# Patient Record
Sex: Female | Born: 1935 | Race: White | Hispanic: No | Smoking: Former smoker
Health system: Southern US, Community
[De-identification: ages and names within clinical notes are randomized; demographics above are authoritative.]

## PROBLEM LIST (undated history)

## (undated) DIAGNOSIS — H3530 Unspecified macular degeneration: Secondary | ICD-10-CM

## (undated) DIAGNOSIS — F0280 Dementia in other diseases classified elsewhere without behavioral disturbance: Secondary | ICD-10-CM

## (undated) DIAGNOSIS — G309 Alzheimer's disease, unspecified: Secondary | ICD-10-CM

## (undated) HISTORY — PX: OTHER SURGICAL HISTORY: SHX169

---

## 2010-12-09 ENCOUNTER — Encounter (INDEPENDENT_AMBULATORY_CARE_PROVIDER_SITE_OTHER): Payer: Medicare Other | Admitting: Ophthalmology

## 2010-12-09 DIAGNOSIS — H353290 Exudative age-related macular degeneration, unspecified eye, stage unspecified: Secondary | ICD-10-CM

## 2010-12-09 DIAGNOSIS — H43819 Vitreous degeneration, unspecified eye: Secondary | ICD-10-CM

## 2010-12-09 DIAGNOSIS — H3530 Unspecified macular degeneration: Secondary | ICD-10-CM

## 2010-12-12 ENCOUNTER — Encounter (INDEPENDENT_AMBULATORY_CARE_PROVIDER_SITE_OTHER): Payer: Self-pay | Admitting: Ophthalmology

## 2010-12-16 ENCOUNTER — Encounter (INDEPENDENT_AMBULATORY_CARE_PROVIDER_SITE_OTHER): Payer: Medicare Other | Admitting: Ophthalmology

## 2010-12-16 DIAGNOSIS — H3530 Unspecified macular degeneration: Secondary | ICD-10-CM

## 2010-12-16 DIAGNOSIS — H43819 Vitreous degeneration, unspecified eye: Secondary | ICD-10-CM

## 2010-12-16 DIAGNOSIS — H353290 Exudative age-related macular degeneration, unspecified eye, stage unspecified: Secondary | ICD-10-CM

## 2011-01-27 ENCOUNTER — Encounter (INDEPENDENT_AMBULATORY_CARE_PROVIDER_SITE_OTHER): Payer: Medicare Other | Admitting: Ophthalmology

## 2011-01-30 ENCOUNTER — Encounter (INDEPENDENT_AMBULATORY_CARE_PROVIDER_SITE_OTHER): Payer: Medicare Other | Admitting: Ophthalmology

## 2011-01-30 DIAGNOSIS — H353290 Exudative age-related macular degeneration, unspecified eye, stage unspecified: Secondary | ICD-10-CM

## 2011-01-30 DIAGNOSIS — H3530 Unspecified macular degeneration: Secondary | ICD-10-CM

## 2011-01-30 DIAGNOSIS — H43819 Vitreous degeneration, unspecified eye: Secondary | ICD-10-CM

## 2011-02-20 ENCOUNTER — Encounter (INDEPENDENT_AMBULATORY_CARE_PROVIDER_SITE_OTHER): Payer: Medicare Other | Admitting: Ophthalmology

## 2011-02-20 DIAGNOSIS — H353290 Exudative age-related macular degeneration, unspecified eye, stage unspecified: Secondary | ICD-10-CM

## 2011-02-20 DIAGNOSIS — H3530 Unspecified macular degeneration: Secondary | ICD-10-CM

## 2011-02-20 DIAGNOSIS — H43819 Vitreous degeneration, unspecified eye: Secondary | ICD-10-CM

## 2011-03-24 ENCOUNTER — Encounter (INDEPENDENT_AMBULATORY_CARE_PROVIDER_SITE_OTHER): Payer: Medicare Other | Admitting: Ophthalmology

## 2011-03-24 DIAGNOSIS — H353290 Exudative age-related macular degeneration, unspecified eye, stage unspecified: Secondary | ICD-10-CM

## 2011-03-24 DIAGNOSIS — H43819 Vitreous degeneration, unspecified eye: Secondary | ICD-10-CM

## 2011-03-24 DIAGNOSIS — H3530 Unspecified macular degeneration: Secondary | ICD-10-CM

## 2011-05-01 ENCOUNTER — Encounter (INDEPENDENT_AMBULATORY_CARE_PROVIDER_SITE_OTHER): Payer: Medicare Other | Admitting: Ophthalmology

## 2011-05-01 DIAGNOSIS — H43819 Vitreous degeneration, unspecified eye: Secondary | ICD-10-CM

## 2011-05-01 DIAGNOSIS — H3530 Unspecified macular degeneration: Secondary | ICD-10-CM

## 2011-05-01 DIAGNOSIS — H353290 Exudative age-related macular degeneration, unspecified eye, stage unspecified: Secondary | ICD-10-CM

## 2011-05-15 ENCOUNTER — Other Ambulatory Visit (INDEPENDENT_AMBULATORY_CARE_PROVIDER_SITE_OTHER): Payer: Medicare Other | Admitting: Ophthalmology

## 2011-05-15 DIAGNOSIS — H26499 Other secondary cataract, unspecified eye: Secondary | ICD-10-CM

## 2011-06-05 ENCOUNTER — Encounter (INDEPENDENT_AMBULATORY_CARE_PROVIDER_SITE_OTHER): Payer: Medicare Other | Admitting: Ophthalmology

## 2011-06-05 DIAGNOSIS — H43819 Vitreous degeneration, unspecified eye: Secondary | ICD-10-CM

## 2011-06-05 DIAGNOSIS — H353290 Exudative age-related macular degeneration, unspecified eye, stage unspecified: Secondary | ICD-10-CM

## 2011-06-05 DIAGNOSIS — H3530 Unspecified macular degeneration: Secondary | ICD-10-CM

## 2011-07-10 ENCOUNTER — Encounter (INDEPENDENT_AMBULATORY_CARE_PROVIDER_SITE_OTHER): Payer: Medicare Other | Admitting: Ophthalmology

## 2011-07-10 DIAGNOSIS — H43819 Vitreous degeneration, unspecified eye: Secondary | ICD-10-CM

## 2011-07-10 DIAGNOSIS — H353290 Exudative age-related macular degeneration, unspecified eye, stage unspecified: Secondary | ICD-10-CM

## 2011-07-10 DIAGNOSIS — H3530 Unspecified macular degeneration: Secondary | ICD-10-CM

## 2011-08-21 ENCOUNTER — Encounter (INDEPENDENT_AMBULATORY_CARE_PROVIDER_SITE_OTHER): Payer: Medicare Other | Admitting: Ophthalmology

## 2011-08-21 DIAGNOSIS — H3530 Unspecified macular degeneration: Secondary | ICD-10-CM

## 2011-08-21 DIAGNOSIS — H353290 Exudative age-related macular degeneration, unspecified eye, stage unspecified: Secondary | ICD-10-CM

## 2011-10-02 ENCOUNTER — Encounter (INDEPENDENT_AMBULATORY_CARE_PROVIDER_SITE_OTHER): Payer: Medicare Other | Admitting: Ophthalmology

## 2011-10-02 DIAGNOSIS — H43819 Vitreous degeneration, unspecified eye: Secondary | ICD-10-CM

## 2011-10-02 DIAGNOSIS — H35039 Hypertensive retinopathy, unspecified eye: Secondary | ICD-10-CM

## 2011-10-02 DIAGNOSIS — I10 Essential (primary) hypertension: Secondary | ICD-10-CM

## 2011-10-02 DIAGNOSIS — H3530 Unspecified macular degeneration: Secondary | ICD-10-CM

## 2011-10-02 DIAGNOSIS — H353290 Exudative age-related macular degeneration, unspecified eye, stage unspecified: Secondary | ICD-10-CM

## 2011-11-13 ENCOUNTER — Encounter (INDEPENDENT_AMBULATORY_CARE_PROVIDER_SITE_OTHER): Payer: Medicare Other | Admitting: Ophthalmology

## 2011-11-13 DIAGNOSIS — H2700 Aphakia, unspecified eye: Secondary | ICD-10-CM

## 2011-11-13 DIAGNOSIS — H3530 Unspecified macular degeneration: Secondary | ICD-10-CM

## 2011-11-13 DIAGNOSIS — H353290 Exudative age-related macular degeneration, unspecified eye, stage unspecified: Secondary | ICD-10-CM

## 2011-11-13 DIAGNOSIS — H43819 Vitreous degeneration, unspecified eye: Secondary | ICD-10-CM

## 2012-02-19 ENCOUNTER — Encounter (INDEPENDENT_AMBULATORY_CARE_PROVIDER_SITE_OTHER): Payer: Medicare Other | Admitting: Ophthalmology

## 2012-02-19 DIAGNOSIS — H43819 Vitreous degeneration, unspecified eye: Secondary | ICD-10-CM

## 2012-02-19 DIAGNOSIS — H3530 Unspecified macular degeneration: Secondary | ICD-10-CM

## 2012-02-19 DIAGNOSIS — H353290 Exudative age-related macular degeneration, unspecified eye, stage unspecified: Secondary | ICD-10-CM

## 2012-05-27 ENCOUNTER — Encounter (INDEPENDENT_AMBULATORY_CARE_PROVIDER_SITE_OTHER): Payer: Medicare Other | Admitting: Ophthalmology

## 2012-05-27 DIAGNOSIS — H2700 Aphakia, unspecified eye: Secondary | ICD-10-CM

## 2012-05-27 DIAGNOSIS — H43819 Vitreous degeneration, unspecified eye: Secondary | ICD-10-CM

## 2012-05-27 DIAGNOSIS — H3530 Unspecified macular degeneration: Secondary | ICD-10-CM

## 2012-05-27 DIAGNOSIS — H353290 Exudative age-related macular degeneration, unspecified eye, stage unspecified: Secondary | ICD-10-CM

## 2012-08-26 ENCOUNTER — Encounter (INDEPENDENT_AMBULATORY_CARE_PROVIDER_SITE_OTHER): Payer: Medicare Other | Admitting: Ophthalmology

## 2012-08-26 DIAGNOSIS — H3530 Unspecified macular degeneration: Secondary | ICD-10-CM

## 2012-08-26 DIAGNOSIS — H353290 Exudative age-related macular degeneration, unspecified eye, stage unspecified: Secondary | ICD-10-CM

## 2012-08-26 DIAGNOSIS — H43819 Vitreous degeneration, unspecified eye: Secondary | ICD-10-CM

## 2012-11-18 ENCOUNTER — Encounter (INDEPENDENT_AMBULATORY_CARE_PROVIDER_SITE_OTHER): Payer: Medicare Other | Admitting: Ophthalmology

## 2012-11-18 DIAGNOSIS — H353290 Exudative age-related macular degeneration, unspecified eye, stage unspecified: Secondary | ICD-10-CM

## 2012-11-18 DIAGNOSIS — H43819 Vitreous degeneration, unspecified eye: Secondary | ICD-10-CM

## 2012-11-18 DIAGNOSIS — H3530 Unspecified macular degeneration: Secondary | ICD-10-CM

## 2013-02-17 ENCOUNTER — Encounter (INDEPENDENT_AMBULATORY_CARE_PROVIDER_SITE_OTHER): Payer: Medicare Other | Admitting: Ophthalmology

## 2013-02-17 DIAGNOSIS — H353290 Exudative age-related macular degeneration, unspecified eye, stage unspecified: Secondary | ICD-10-CM

## 2013-02-17 DIAGNOSIS — H43819 Vitreous degeneration, unspecified eye: Secondary | ICD-10-CM

## 2013-02-17 DIAGNOSIS — H3530 Unspecified macular degeneration: Secondary | ICD-10-CM

## 2013-05-26 ENCOUNTER — Emergency Department (HOSPITAL_COMMUNITY): Payer: Medicare Other

## 2013-05-26 ENCOUNTER — Encounter (INDEPENDENT_AMBULATORY_CARE_PROVIDER_SITE_OTHER): Payer: Medicare Other | Admitting: Ophthalmology

## 2013-05-26 ENCOUNTER — Emergency Department (HOSPITAL_COMMUNITY)
Admission: EM | Admit: 2013-05-26 | Discharge: 2013-05-26 | Disposition: A | Discharge status: Home / Self Care | Payer: Medicare Other | Attending: Emergency Medicine | Admitting: Emergency Medicine

## 2013-05-26 ENCOUNTER — Encounter (HOSPITAL_COMMUNITY): Payer: Self-pay | Admitting: Emergency Medicine

## 2013-05-26 DIAGNOSIS — D259 Leiomyoma of uterus, unspecified: Secondary | ICD-10-CM | POA: Insufficient documentation

## 2013-05-26 DIAGNOSIS — S01409A Unspecified open wound of unspecified cheek and temporomandibular area, initial encounter: Secondary | ICD-10-CM | POA: Insufficient documentation

## 2013-05-26 DIAGNOSIS — Y92410 Unspecified street and highway as the place of occurrence of the external cause: Secondary | ICD-10-CM | POA: Insufficient documentation

## 2013-05-26 DIAGNOSIS — H43819 Vitreous degeneration, unspecified eye: Secondary | ICD-10-CM

## 2013-05-26 DIAGNOSIS — R109 Unspecified abdominal pain: Secondary | ICD-10-CM | POA: Insufficient documentation

## 2013-05-26 DIAGNOSIS — M47812 Spondylosis without myelopathy or radiculopathy, cervical region: Secondary | ICD-10-CM | POA: Insufficient documentation

## 2013-05-26 DIAGNOSIS — M899 Disorder of bone, unspecified: Secondary | ICD-10-CM | POA: Insufficient documentation

## 2013-05-26 DIAGNOSIS — IMO0002 Reserved for concepts with insufficient information to code with codable children: Secondary | ICD-10-CM | POA: Insufficient documentation

## 2013-05-26 DIAGNOSIS — S0181XA Laceration without foreign body of other part of head, initial encounter: Secondary | ICD-10-CM

## 2013-05-26 DIAGNOSIS — Z8781 Personal history of (healed) traumatic fracture: Secondary | ICD-10-CM | POA: Insufficient documentation

## 2013-05-26 DIAGNOSIS — M949 Disorder of cartilage, unspecified: Secondary | ICD-10-CM

## 2013-05-26 DIAGNOSIS — Y939 Activity, unspecified: Secondary | ICD-10-CM | POA: Insufficient documentation

## 2013-05-26 DIAGNOSIS — Z780 Asymptomatic menopausal state: Secondary | ICD-10-CM | POA: Insufficient documentation

## 2013-05-26 DIAGNOSIS — T07XXXA Unspecified multiple injuries, initial encounter: Secondary | ICD-10-CM

## 2013-05-26 DIAGNOSIS — M47817 Spondylosis without myelopathy or radiculopathy, lumbosacral region: Secondary | ICD-10-CM | POA: Insufficient documentation

## 2013-05-26 DIAGNOSIS — H353290 Exudative age-related macular degeneration, unspecified eye, stage unspecified: Secondary | ICD-10-CM

## 2013-05-26 DIAGNOSIS — M79609 Pain in unspecified limb: Secondary | ICD-10-CM | POA: Insufficient documentation

## 2013-05-26 DIAGNOSIS — F0390 Unspecified dementia without behavioral disturbance: Secondary | ICD-10-CM | POA: Insufficient documentation

## 2013-05-26 DIAGNOSIS — N949 Unspecified condition associated with female genital organs and menstrual cycle: Secondary | ICD-10-CM | POA: Insufficient documentation

## 2013-05-26 DIAGNOSIS — H3530 Unspecified macular degeneration: Secondary | ICD-10-CM

## 2013-05-26 HISTORY — DX: Unspecified macular degeneration: H35.30

## 2013-05-26 HISTORY — DX: Alzheimer's disease, unspecified: G30.9

## 2013-05-26 HISTORY — DX: Dementia in other diseases classified elsewhere, unspecified severity, without behavioral disturbance, psychotic disturbance, mood disturbance, and anxiety: F02.80

## 2013-05-26 LAB — POCT I-STAT, CHEM 8
BUN: 15 mg/dL (ref 6–23)
CALCIUM ION: 1.18 mmol/L (ref 1.13–1.30)
CHLORIDE: 101 meq/L (ref 96–112)
Creatinine, Ser: 0.9 mg/dL (ref 0.50–1.10)
GLUCOSE: 85 mg/dL (ref 70–99)
HCT: 34 % — ABNORMAL LOW (ref 36.0–46.0)
Hemoglobin: 11.6 g/dL — ABNORMAL LOW (ref 12.0–15.0)
Potassium: 3.4 mEq/L — ABNORMAL LOW (ref 3.7–5.3)
Sodium: 142 mEq/L (ref 137–147)
TCO2: 27 mmol/L (ref 0–100)

## 2013-05-26 LAB — URINALYSIS, ROUTINE W REFLEX MICROSCOPIC
Bilirubin Urine: NEGATIVE
GLUCOSE, UA: NEGATIVE mg/dL
Hgb urine dipstick: NEGATIVE
KETONES UR: NEGATIVE mg/dL
Leukocytes, UA: NEGATIVE
NITRITE: NEGATIVE
PROTEIN: NEGATIVE mg/dL
Specific Gravity, Urine: 1.01 (ref 1.005–1.030)
Urobilinogen, UA: 0.2 mg/dL (ref 0.0–1.0)
pH: 7.5 (ref 5.0–8.0)

## 2013-05-26 LAB — CBC WITH DIFFERENTIAL/PLATELET
BASOS ABS: 0 10*3/uL (ref 0.0–0.1)
Basophils Relative: 1 % (ref 0–1)
EOS ABS: 0.1 10*3/uL (ref 0.0–0.7)
EOS PCT: 1 % (ref 0–5)
HEMATOCRIT: 31.6 % — AB (ref 36.0–46.0)
Hemoglobin: 10.8 g/dL — ABNORMAL LOW (ref 12.0–15.0)
Lymphocytes Relative: 15 % (ref 12–46)
Lymphs Abs: 1.2 10*3/uL (ref 0.7–4.0)
MCH: 32.4 pg (ref 26.0–34.0)
MCHC: 34.2 g/dL (ref 30.0–36.0)
MCV: 94.9 fL (ref 78.0–100.0)
Monocytes Absolute: 0.7 10*3/uL (ref 0.1–1.0)
Monocytes Relative: 8 % (ref 3–12)
Neutro Abs: 6.2 10*3/uL (ref 1.7–7.7)
Neutrophils Relative %: 75 % (ref 43–77)
PLATELETS: 200 10*3/uL (ref 150–400)
RBC: 3.33 MIL/uL — ABNORMAL LOW (ref 3.87–5.11)
RDW: 13.9 % (ref 11.5–15.5)
WBC: 8.2 10*3/uL (ref 4.0–10.5)

## 2013-05-26 MED ORDER — SODIUM CHLORIDE 0.9 % IV BOLUS (SEPSIS)
500.0000 mL | Freq: Once | INTRAVENOUS | Status: AC
  Administered 2013-05-26: 500 mL via INTRAVENOUS

## 2013-05-26 MED ORDER — HYDROMORPHONE HCL PF 1 MG/ML IJ SOLN
0.5000 mg | Freq: Once | INTRAMUSCULAR | Status: AC
  Administered 2013-05-26: 0.5 mg via INTRAVENOUS
  Filled 2013-05-26: qty 1

## 2013-05-26 MED ORDER — IOHEXOL 300 MG/ML  SOLN
100.0000 mL | Freq: Once | INTRAMUSCULAR | Status: AC | PRN
  Administered 2013-05-26: 100 mL via INTRAVENOUS

## 2013-05-26 MED ORDER — MORPHINE SULFATE 4 MG/ML IJ SOLN
4.0000 mg | Freq: Once | INTRAMUSCULAR | Status: AC
  Administered 2013-05-26: 4 mg via INTRAVENOUS
  Filled 2013-05-26: qty 1

## 2013-05-26 MED ORDER — HYDROCODONE-ACETAMINOPHEN 5-325 MG PO TABS: 1.0000 | ORAL_TABLET | Freq: Four times a day (QID) | ORAL | Status: DC | PRN

## 2013-05-26 MED ORDER — OXYCODONE-ACETAMINOPHEN 5-325 MG PO TABS
1.0000 | ORAL_TABLET | Freq: Once | ORAL | Status: AC
  Administered 2013-05-26: 1 via ORAL
  Filled 2013-05-26: qty 1

## 2013-05-26 NOTE — ED Notes (Signed)
Pt given ER walker per Case Management and Charge nurse. Family states will return.

## 2013-05-26 NOTE — ED Notes (Signed)
Pt continues to c/o R elbow pain.
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
## 2013-05-26 NOTE — ED Provider Notes (Signed)
I was asked by Dr. Stark Jock to close the facial laceration.  LACERATION REPAIR Performed by: Montine Circle Authorized by: Montine Circle Consent: Verbal consent obtained. Risks and benefits: risks, benefits and alternatives were discussed Consent given by: patient Patient identity confirmed: provided demographic data Prepped and Draped in normal sterile fashion Wound explored  Laceration Location: right cheek, adjacent to eye  Laceration Length: 2cm  No Foreign Bodies seen or palpated  Anesthesia: local infiltration  Local anesthetic: lidocaine 2% with epinephrine  Anesthetic total: 3 ml  Irrigation method: syringe Amount of cleaning: standard  Skin closure: 5-0 prolene  Number of sutures: 3  Technique: simple  Patient tolerance: Patient tolerated the procedure well with no immediate complications.  Discussed suture removal in 5 days.  Discussed signs and symptoms of infection and return precautions.   Montine Circle, PA-C 05/26/13 1528

## 2013-05-26 NOTE — ED Notes (Addendum)
Attempted to ambulate pt.  Pt c/o increased pain when bearing weight on R leg.  Dr Stark Jock notified.  PT assisted to bedside commode.

## 2013-05-26 NOTE — ED Notes (Signed)
PT able to ambulate with walker per request.  No assistance needed with walker.  MD notified.
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
## 2013-05-26 NOTE — ED Notes (Signed)
Pt placed back on monitor, continuous pulse oximetry and blood pressure cuff; family at bedside 
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
## 2013-05-26 NOTE — ED Notes (Signed)
PT continues to c/o pain to R arm.  MD notified.

## 2013-05-26 NOTE — ED Notes (Signed)
Social work called per pt daughter's request - Pt refuses to live with family even though she has alzheimers.
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
## 2013-05-26 NOTE — ED Notes (Signed)
Pt alert with confusion noted. Ambulates with walker. WC to car.

## 2013-05-26 NOTE — ED Notes (Signed)
Pt being transported out of the department at this time

## 2013-05-26 NOTE — ED Notes (Signed)
Case management speaking with family.

## 2013-05-27 NOTE — ED Provider Notes (Signed)
Medical screening examination/treatment/procedure(s) were performed by non-physician practitioner and as supervising physician I was immediately available for consultation/collaboration.     Trafton Roker, MD 05/27/13 0727 

## 2013-08-18 ENCOUNTER — Encounter (INDEPENDENT_AMBULATORY_CARE_PROVIDER_SITE_OTHER): Payer: Medicare Other | Admitting: Ophthalmology

## 2013-08-18 DIAGNOSIS — H43819 Vitreous degeneration, unspecified eye: Secondary | ICD-10-CM

## 2013-08-18 DIAGNOSIS — H3530 Unspecified macular degeneration: Secondary | ICD-10-CM

## 2013-08-18 DIAGNOSIS — H353290 Exudative age-related macular degeneration, unspecified eye, stage unspecified: Secondary | ICD-10-CM

## 2013-11-10 ENCOUNTER — Encounter (INDEPENDENT_AMBULATORY_CARE_PROVIDER_SITE_OTHER): Payer: Medicare Other | Admitting: Ophthalmology

## 2013-11-10 DIAGNOSIS — H3530 Unspecified macular degeneration: Secondary | ICD-10-CM

## 2013-11-10 DIAGNOSIS — H353290 Exudative age-related macular degeneration, unspecified eye, stage unspecified: Secondary | ICD-10-CM

## 2013-11-10 DIAGNOSIS — H43819 Vitreous degeneration, unspecified eye: Secondary | ICD-10-CM

## 2014-02-15 ENCOUNTER — Encounter (INDEPENDENT_AMBULATORY_CARE_PROVIDER_SITE_OTHER): Payer: Medicare Other | Admitting: Ophthalmology

## 2014-02-17 ENCOUNTER — Encounter (INDEPENDENT_AMBULATORY_CARE_PROVIDER_SITE_OTHER): Payer: Medicare Other | Admitting: Ophthalmology

## 2014-02-17 DIAGNOSIS — H43813 Vitreous degeneration, bilateral: Secondary | ICD-10-CM

## 2014-02-17 DIAGNOSIS — H3532 Exudative age-related macular degeneration: Secondary | ICD-10-CM

## 2014-02-17 DIAGNOSIS — H3531 Nonexudative age-related macular degeneration: Secondary | ICD-10-CM

## 2014-05-26 ENCOUNTER — Encounter (INDEPENDENT_AMBULATORY_CARE_PROVIDER_SITE_OTHER): Payer: Medicare Other | Admitting: Ophthalmology

## 2014-12-17 ENCOUNTER — Encounter (HOSPITAL_COMMUNITY): Payer: Self-pay | Admitting: Emergency Medicine

## 2014-12-17 ENCOUNTER — Emergency Department (HOSPITAL_COMMUNITY)
Admission: EM | Admit: 2014-12-17 | Discharge: 2014-12-18 | Disposition: A | Discharge status: Home / Self Care | Payer: Medicare Other | Attending: Emergency Medicine | Admitting: Emergency Medicine

## 2014-12-17 ENCOUNTER — Emergency Department (HOSPITAL_COMMUNITY): Payer: Medicare Other

## 2014-12-17 DIAGNOSIS — S99921A Unspecified injury of right foot, initial encounter: Secondary | ICD-10-CM | POA: Insufficient documentation

## 2014-12-17 DIAGNOSIS — Z79899 Other long term (current) drug therapy: Secondary | ICD-10-CM | POA: Insufficient documentation

## 2014-12-17 DIAGNOSIS — Z7951 Long term (current) use of inhaled steroids: Secondary | ICD-10-CM | POA: Insufficient documentation

## 2014-12-17 DIAGNOSIS — G309 Alzheimer's disease, unspecified: Secondary | ICD-10-CM | POA: Diagnosis not present

## 2014-12-17 DIAGNOSIS — Z87891 Personal history of nicotine dependence: Secondary | ICD-10-CM | POA: Insufficient documentation

## 2014-12-17 DIAGNOSIS — Y998 Other external cause status: Secondary | ICD-10-CM | POA: Insufficient documentation

## 2014-12-17 DIAGNOSIS — Y9389 Activity, other specified: Secondary | ICD-10-CM | POA: Diagnosis not present

## 2014-12-17 DIAGNOSIS — F419 Anxiety disorder, unspecified: Secondary | ICD-10-CM | POA: Insufficient documentation

## 2014-12-17 DIAGNOSIS — F0280 Dementia in other diseases classified elsewhere without behavioral disturbance: Secondary | ICD-10-CM | POA: Insufficient documentation

## 2014-12-17 DIAGNOSIS — Y9289 Other specified places as the place of occurrence of the external cause: Secondary | ICD-10-CM | POA: Diagnosis not present

## 2014-12-17 DIAGNOSIS — X58XXXA Exposure to other specified factors, initial encounter: Secondary | ICD-10-CM | POA: Insufficient documentation

## 2014-12-17 MED ORDER — LIDOCAINE-EPINEPHRINE (PF) 2 %-1:200000 IJ SOLN
20.0000 mL | Freq: Once | INTRAMUSCULAR | Status: DC
  Filled 2014-12-17: qty 20

## 2014-12-17 MED ORDER — LIDOCAINE HCL 2 % IJ SOLN
20.0000 mL | Freq: Once | INTRAMUSCULAR | Status: AC
  Administered 2014-12-17: 400 mg
  Filled 2014-12-17: qty 20

## 2014-12-17 NOTE — ED Provider Notes (Signed)
CSN: [id number]     Arrival date & time 12/17/14  2219 History   First MD Initiated Contact with Patient 12/17/14 2229     Chief Complaint  Patient presents with  . Toe Injury     (Consider location/radiation/quality/duration/timing/severity/associated sxs/prior Treatment) HPI Comments: Patient here after sustaining an injury to her right great toe. It is unknown how she sustained this injury. She was noted to have a pool of blood under her right foot. She has a history of dementia and her baseline is that she is unable to carry a conversation. No other injuries noted.  The history is provided by a relative and the nursing home. The history is limited by the condition of the patient.    Past Medical History  Diagnosis Date  . Macular degeneration   . Alzheimer's dementia    Past Surgical History  Procedure Laterality Date  . Arm surgery Right    No family history on file. Social History  Substance Use Topics  . Smoking status: Former Smoker    Types: Cigarettes  . Smokeless tobacco: None  . Alcohol Use: No   OB History    No data available     Review of Systems  All other systems reviewed and are negative.     Allergies  Review of patient's allergies indicates no known allergies.  Home Medications   Prior to Admission medications   Medication Sig Start Date End Date Taking? Authorizing Provider  CALCIUM PO Take 1 tablet by mouth daily.    Historical Provider, MD  ciprofloxacin (CIPRO) 250 MG tablet Take 250 mg by mouth 2 (two) times daily. For 10 days, started 05/17/13 05/17/13   Historical Provider, MD  Cyanocobalamin (B-12 PO) Take 1 tablet by mouth daily.    Historical Provider, MD  fexofenadine (ALLEGRA) 30 MG tablet Take 30 mg by mouth daily.    Historical Provider, MD  fluticasone (FLONASE) 50 MCG/ACT nasal spray Place 2 sprays into both nostrils daily as needed. For congestion 02/16/13   Historical Provider, MD  HYDROcodone-acetaminophen (NORCO/VICODIN)  5-325 MG per tablet Take 1 tablet by mouth every 6 (six) hours as needed. 05/26/13   Kristen N Ward, DO  Multiple Vitamin (MULTIVITAMIN WITH MINERALS) TABS tablet Take 1 tablet by mouth daily.    Historical Provider, MD  Multiple Vitamins-Minerals (PRESERVISION AREDS 2) CAPS Take 1 capsule by mouth 2 (two) times daily.    Historical Provider, MD  Omega-3 Fatty Acids (FISH OIL PO) Take 1 capsule by mouth daily.    Historical Provider, MD  sertraline (ZOLOFT) 100 MG tablet Take 50 mg by mouth daily. 04/22/13   Historical Provider, MD   BP 105/55 mmHg  Pulse 81  Temp(Src) 97.9 F (36.6 C) (Axillary)  Resp 18  SpO2 99% Physical Exam  Constitutional: She appears well-developed and well-nourished.  Non-toxic appearance. No distress.  HENT:  Head: Normocephalic and atraumatic.  Eyes: Conjunctivae, EOM and lids are normal. Pupils are equal, round, and reactive to light.  Neck: Normal range of motion. Neck supple. No tracheal deviation present. No thyroid mass present.  Cardiovascular: Normal rate, regular rhythm and normal heart sounds.  Exam reveals no gallop.   No murmur heard. Pulmonary/Chest: Effort normal and breath sounds normal. No stridor. No respiratory distress. She has no decreased breath sounds. She has no wheezes. She has no rhonchi. She has no rales.  Abdominal: Soft. Normal appearance and bowel sounds are normal. She exhibits no distension. There is no tenderness. There is no  rebound and no CVA tenderness.  Musculoskeletal: Normal range of motion. She exhibits no edema or tenderness.       Feet:  Neurological: She is alert. No cranial nerve deficit. GCS eye subscore is 4. GCS verbal subscore is 5. GCS motor subscore is 6.  Skin: Skin is warm and dry. No abrasion and no rash noted.  Psychiatric: Her mood appears anxious.  Nursing note and vitals reviewed.   ED Course  Procedures (including critical care time) Labs Review Labs Reviewed - No data to display  Imaging Review No  results found. I have personally reviewed and evaluated these images and lab results as part of my medical decision-making.   EKG Interpretation None      MDM   Final diagnoses:  None    Patient with possible small fractures noted in the report. Patient's right great toe cleaned and patient has a nonsuturable contact beneath the skin as has to the nail. Bleeding is controlled. Wound was dressed with bacitracin and a nonadherent dressing. Wound care instructions will be sent to the nursing home. No evidence of cellulitis of the patient's foot at this time. According to the daughter, patient commonly has swelling of her right lower extremity and her current situation is no different    Lacretia Leigh, MD 12/18/14 [phone]

## 2014-12-17 NOTE — ED Notes (Signed)
Per [facility], nursing staff at [facility] called b/c they noticed that pt had injured her toe.  They reported that she did not fall from her wheelchair.  EMS reported that pt's vitals were stable in route: 123/76, 82 pulse and 97% on room air.  Pt does suffer from dementia.

## 2014-12-18 NOTE — Discharge Instructions (Signed)
Apply topical antibiotics ointment to the patient's right great toe once a day. Performance until the wound is healed. Follow-up with your doctor. Return to the hospital for fever, red streaks going up the top of the patient's foot or leg, or pus like drainage from the toe. Patient has a possible tiny chip fracture of the right calcaneus and right lateral great toe according to the radiology reading.

## 2014-12-18 NOTE — ED Notes (Signed)
Pt left at this time with [facility] escorted to [facility]. D/C summary and edu given to [facility] staff. Report called to SNF by previous shift nurse. No belongings left in ED tx room.

## 2014-12-18 NOTE — ED Notes (Signed)
Requested sec. Contact [facility]

## 2015-08-27 DEATH — deceased

## 2016-05-03 IMAGING — CR DG TOE GREAT 2+V*R*
3 series · 3 of 3 positions shown · non-contrast
Comparison: None.

CLINICAL DATA: Great toe pain after injury while in wheelchair.

EXAM:
RIGHT GREAT TOE

[toe ap]
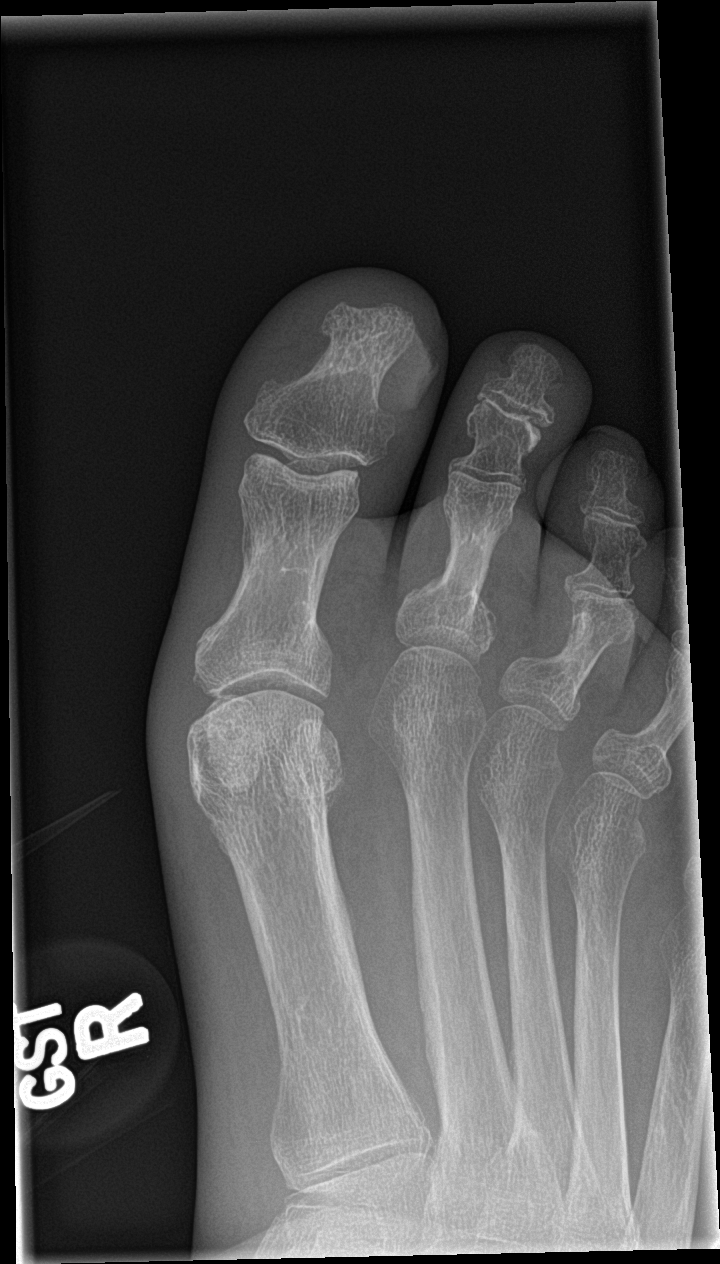

[toe obl]
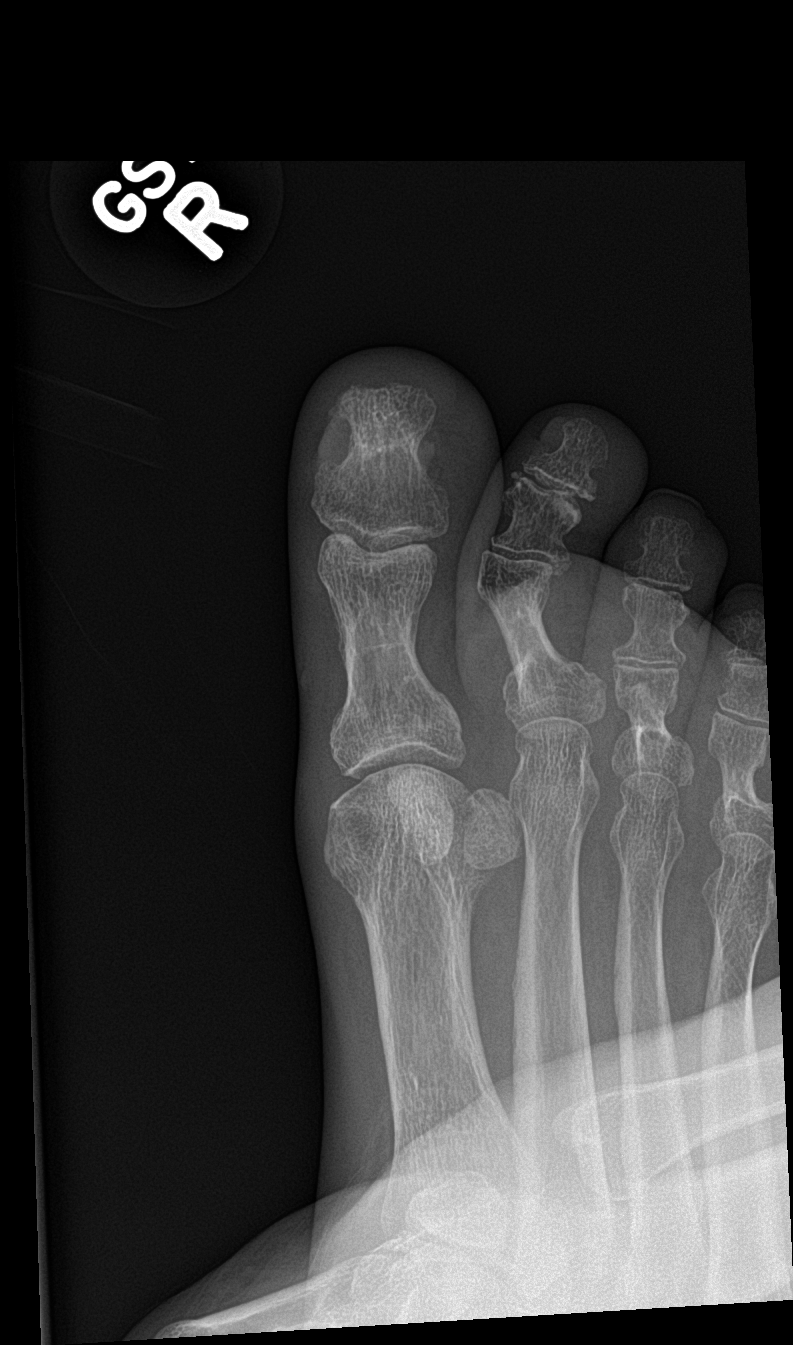

[toe lat]
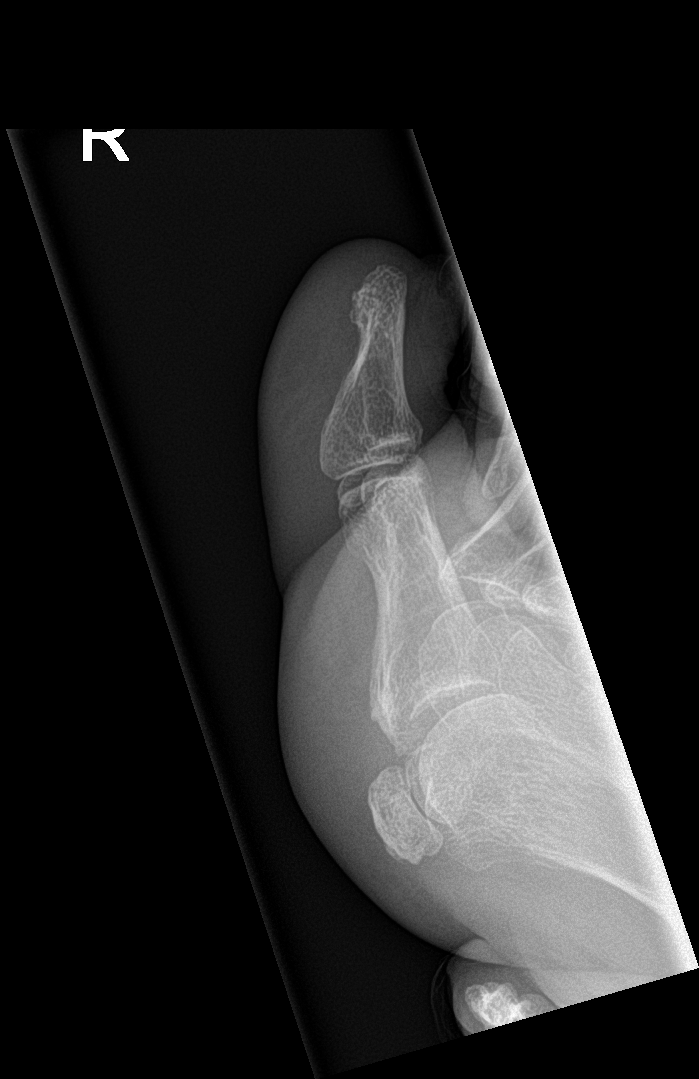

[3 of 3 positions shown; findings below may reference images not displayed]

FINDINGS: Questionable tiny nondisplaced fracture involving the great toe
distal phalanx lateral dorsal aspect. The remainder the great toe is
intact. No radiopaque foreign body.
IMPRESSION: Questionable tiny nondisplaced fracture great toe distal phalanx
involving the lateral dorsal aspect.
